# Patient Record
Sex: Female | Born: 1960 | Race: Black or African American | Hispanic: No | Marital: Married
Health system: Southern US, Community
[De-identification: ages and names within clinical notes are randomized; demographics above are authoritative.]

---

## 2002-04-29 ENCOUNTER — Other Ambulatory Visit: Admission: RE | Admit: 2002-04-29 | Discharge: 2002-04-29 | Payer: Self-pay | Admitting: Unknown Physician Specialty

## 2017-06-19 ENCOUNTER — Other Ambulatory Visit: Payer: Self-pay | Admitting: Nurse Practitioner

## 2017-06-19 DIAGNOSIS — M47816 Spondylosis without myelopathy or radiculopathy, lumbar region: Secondary | ICD-10-CM

## 2017-06-27 ENCOUNTER — Ambulatory Visit
Admission: RE | Admit: 2017-06-27 | Discharge: 2017-06-27 | Disposition: A | Discharge status: Home / Self Care | Payer: Medicare Other | Source: Ambulatory Visit | Attending: Nurse Practitioner | Admitting: Nurse Practitioner

## 2017-06-27 DIAGNOSIS — M47816 Spondylosis without myelopathy or radiculopathy, lumbar region: Secondary | ICD-10-CM

## 2017-06-27 MED ORDER — IOPAMIDOL (ISOVUE-M 200) INJECTION 41%: 1.0000 mL | Freq: Once | INTRAMUSCULAR | Status: DC

## 2017-06-27 MED ORDER — METHYLPREDNISOLONE ACETATE 40 MG/ML INJ SUSP (RADIOLOG: 120.0000 mg | Freq: Once | INTRAMUSCULAR | Status: DC

## 2017-06-27 NOTE — Discharge Instructions (Signed)

## 2019-02-10 ENCOUNTER — Other Ambulatory Visit: Payer: Self-pay | Admitting: Orthopedic Surgery

## 2019-02-10 ENCOUNTER — Telehealth: Payer: Self-pay | Admitting: Nurse Practitioner

## 2019-02-10 DIAGNOSIS — G8929 Other chronic pain: Secondary | ICD-10-CM

## 2019-02-10 DIAGNOSIS — M545 Low back pain: Principal | ICD-10-CM

## 2019-02-10 NOTE — Telephone Encounter (Signed)
Phone call to patient to verify medication list and allergies for myelogram procedure. Pt instructed to hold Tramadol for 48hrs prior to myelogram appointment time. Pt verbalized understanding and states she no longer takes this medication.

## 2019-02-23 ENCOUNTER — Other Ambulatory Visit: Payer: Medicare Other

## 2019-03-23 ENCOUNTER — Inpatient Hospital Stay: Admission: RE | Admit: 2019-03-23 | Payer: Medicare Other | Source: Ambulatory Visit

## 2019-03-23 ENCOUNTER — Other Ambulatory Visit: Payer: Medicare Other

## 2019-04-21 ENCOUNTER — Telehealth: Payer: Self-pay | Admitting: Nurse Practitioner

## 2019-04-21 NOTE — Telephone Encounter (Signed)
Phone call to patient to verify medication list and allergies for myelogram procedure. Pt instructed to hold Tramadol for 48hrs prior to myelogram appointment time. Pt verbalized understanding. 

## 2019-05-07 ENCOUNTER — Other Ambulatory Visit: Payer: Self-pay | Admitting: Orthopedic Surgery

## 2019-05-07 ENCOUNTER — Ambulatory Visit
Admission: RE | Admit: 2019-05-07 | Discharge: 2019-05-07 | Disposition: A | Discharge status: Home / Self Care | Payer: Self-pay | Source: Ambulatory Visit | Attending: Orthopedic Surgery | Admitting: Orthopedic Surgery

## 2019-05-07 DIAGNOSIS — R52 Pain, unspecified: Secondary | ICD-10-CM

## 2019-05-07 NOTE — Discharge Instructions (Signed)

## 2019-05-08 ENCOUNTER — Ambulatory Visit
Admission: RE | Admit: 2019-05-08 | Discharge: 2019-05-08 | Disposition: A | Discharge status: Home / Self Care | Payer: Medicare Other | Source: Ambulatory Visit | Attending: Orthopedic Surgery | Admitting: Orthopedic Surgery

## 2019-05-08 ENCOUNTER — Other Ambulatory Visit: Payer: Self-pay

## 2019-05-08 DIAGNOSIS — G8929 Other chronic pain: Secondary | ICD-10-CM

## 2019-05-08 DIAGNOSIS — M545 Low back pain, unspecified: Secondary | ICD-10-CM

## 2019-05-08 MED ORDER — IOPAMIDOL (ISOVUE-M 200) INJECTION 41%
18.0000 mL | Freq: Once | INTRAMUSCULAR | Status: AC
  Administered 2019-05-08: 18 mL via INTRATHECAL

## 2019-05-08 MED ORDER — ONDANSETRON HCL 4 MG/2ML IJ SOLN: 4.0000 mg | Freq: Four times a day (QID) | INTRAMUSCULAR | Status: DC | PRN

## 2019-05-08 MED ORDER — DIAZEPAM 5 MG PO TABS
5.0000 mg | ORAL_TABLET | Freq: Once | ORAL | Status: AC
  Administered 2019-05-08: 5 mg via ORAL

## 2019-05-08 NOTE — Progress Notes (Signed)
Patient states she has been off Tramadol for at least the past two days. 

## 2019-09-23 IMAGING — CT CT LUMBAR SPINE WITH CONTRAST
1 of 6 series · 6 of 14 positions shown, 8 images · non-contrast
Comparison: MRI 05/29/2017

CLINICAL DATA: Low back pain. Bilateral leg pain, somewhat more
extensive on the right than the left.
TECHNIQUE: Contiguous axial images were obtained through the Lumbar spine after
the intrathecal infusion of infusion. Coronal and sagittal
reconstructions were obtained of the axial image sets.

[Series 3: l spine soft · axial · 0.30mm/px · z∈[-190,-38]mm · 6 of 73 slices shown, 8 images]
[im 11/73  soft-tissue]
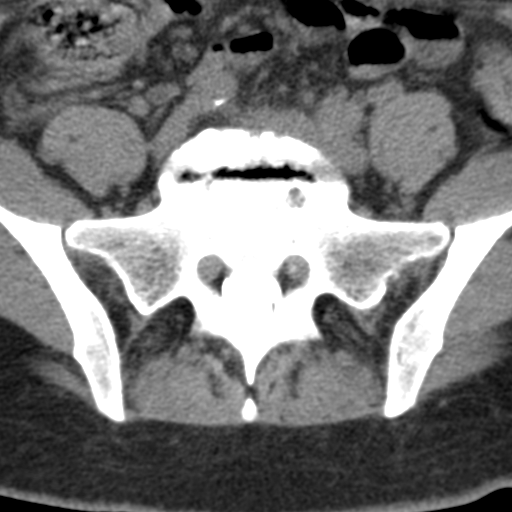
[im 11/73  bone]
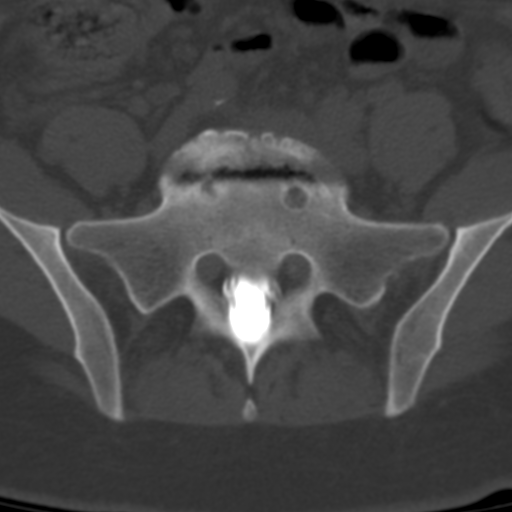
[im 21/73  bone]
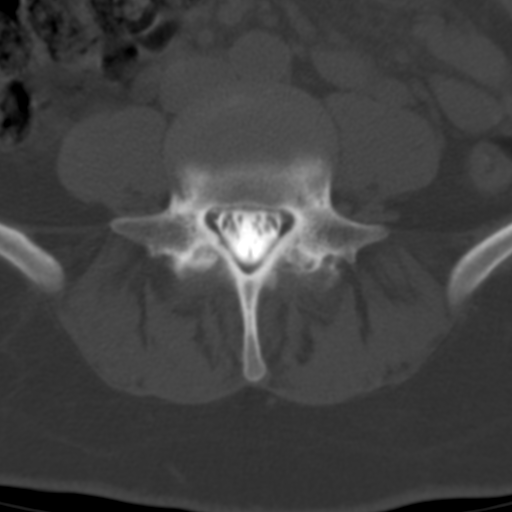
[im 31/73  bone]
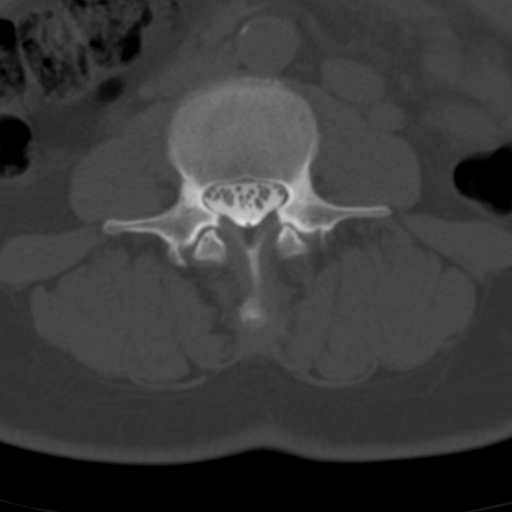
[im 42/73  bone]
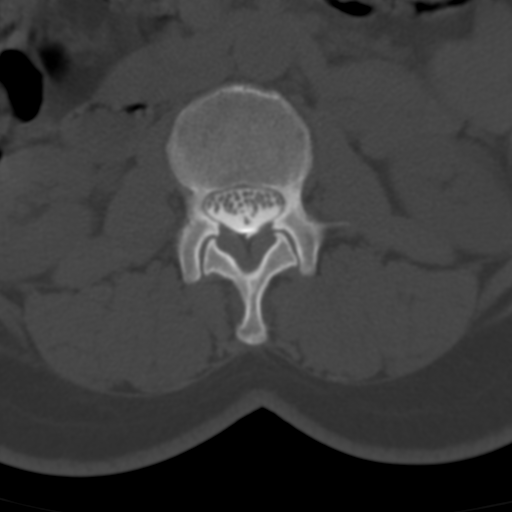
[im 52/73  soft-tissue]
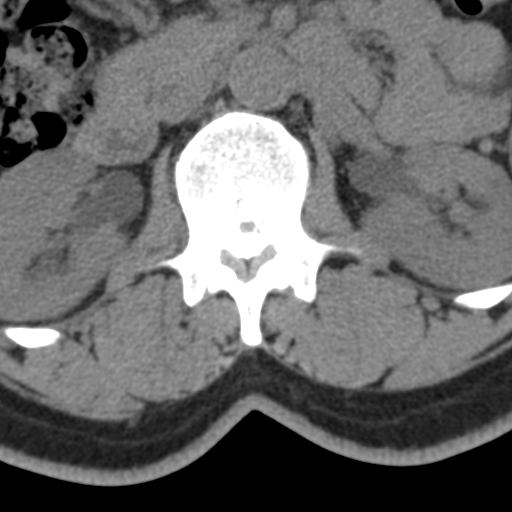
[im 52/73  bone]
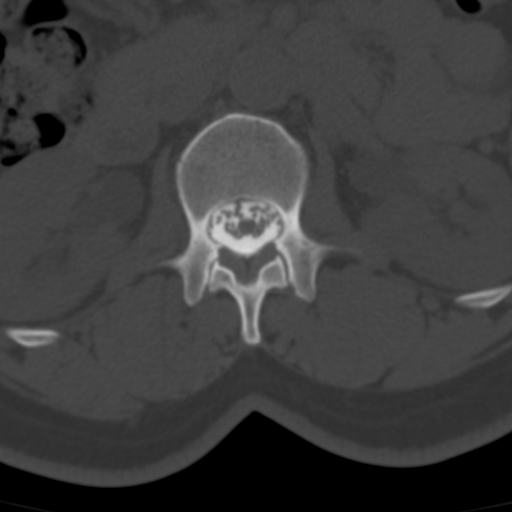
[im 62/73  bone]
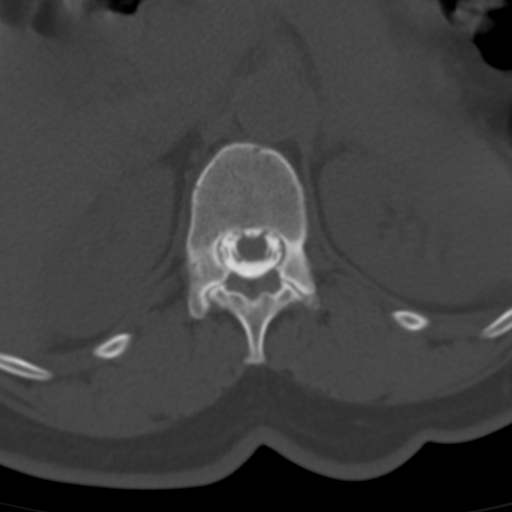

[6 of 14 positions shown; findings below may reference images not displayed]

EXAM:
LUMBAR MYELOGRAM

FLUOROSCOPY TIME:  0 minutes 51 seconds. 226.03 micro gray meter
squared

PROCEDURE:
After thorough discussion of risks and benefits of the procedure
including bleeding, infection, injury to nerves, blood vessels,
adjacent structures as well as headache and CSF leak, written and
oral informed consent was obtained. Consent was obtained by Dr. Isveth
Mariamma. Time out form was completed.

Patient was positioned prone on the fluoroscopy table. Local
anesthesia was provided with 1% lidocaine without epinephrine after
prepped and draped in the usual sterile fashion. Puncture was
performed at L2-3 using a 3 1/2 inch 22-gauge spinal needle via
right paramedian approach. Using a single pass through the dura, the
needle was placed within the thecal sac, with return of clear CSF.
15 mL of Isovue [id number] was injected into the thecal sac, with normal
opacification of the nerve roots and cauda equina consistent with
free flow within the subarachnoid space.

I personally performed the lumbar puncture and administered the
intrathecal contrast. I also personally performed acquisition of the
myelogram images.
FINDINGS: LUMBAR MYELOGRAM FINDINGS:

No significant finding at L3-4 or above.  Minimal disc bulges.

At L4-5, there is 2 mm of anterolisthesis. There is bilateral
lateral recess stenosis that could cause neural compression.

At L5-S1, there is disc space narrowing. There is a small anterior
extradural defect. There is narrowing of both lateral recesses that
could possibly affect the S1 nerves.

Standing lateral flexion extension views show slight increase in
anterolisthesis at L4-5, to about 3 mm.

CT LUMBAR MYELOGRAM FINDINGS:

No abnormality at T12-L1, L1-2 or L2-3.

L3-4: Minimal noncompressive disc bulge. No canal or foraminal
stenosis.

L4-5: Facet degeneration with facet and ligamentous hypertrophy. 2
mm of anterolisthesis. Mild stenosis of both lateral recesses and
neural foramina.

L5-S1: Chronic disc degeneration with loss of disc height. Endplate
osteophytes and bulging of the disc. Vacuum phenomenon. Mild facet
osteoarthritis. Mild bony foraminal encroachment due to osteophytic
encroachment. No compressive canal or foraminal stenosis suspected.

Bilateral sacroiliac osteoarthritis is noted.
IMPRESSION: No significant finding at L3-4 or above.

L4-5: Bilateral facet arthropathy allowing 2 mm of anterolisthesis.
This increases to 3 mm with standing and flexion. Bulging of the
disc. Mild narrowing of the lateral recesses and foramina but
without visible neural compression at CT. Based on the myelogram,
there could be some potential for L5 nerve root irritation in the
lateral recesses.

L5-S1: Chronic disc degeneration with near complete loss of disc
height. Mild facet degeneration. Endplate osteophytes and bulging
disc material. Mild bilateral foraminal narrowing because of
osteophytic encroachment but without definite compression of the
exiting L5 nerves.
